# Patient Record
Sex: Male | Born: 2000 | Hispanic: Yes | Marital: Single | Smoking: Never smoker
Health system: Southern US, Community
[De-identification: ages and names within clinical notes are randomized; demographics above are authoritative.]

---
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
## 2019-11-17 ENCOUNTER — Emergency Department
Admission: EM | Admit: 2019-11-17 | Discharge: 2019-11-17 | Disposition: A | Discharge status: Home / Self Care | Payer: Medicaid Other | Attending: Emergency Medicine | Admitting: Emergency Medicine

## 2019-11-17 ENCOUNTER — Encounter: Payer: Self-pay | Admitting: Emergency Medicine

## 2019-11-17 ENCOUNTER — Other Ambulatory Visit: Payer: Self-pay

## 2019-11-17 DIAGNOSIS — Y92810 Car as the place of occurrence of the external cause: Secondary | ICD-10-CM | POA: Insufficient documentation

## 2019-11-17 DIAGNOSIS — H1132 Conjunctival hemorrhage, left eye: Secondary | ICD-10-CM | POA: Insufficient documentation

## 2019-11-17 DIAGNOSIS — Y29XXXA Contact with blunt object, undetermined intent, initial encounter: Secondary | ICD-10-CM | POA: Insufficient documentation

## 2019-11-17 DIAGNOSIS — Y9301 Activity, walking, marching and hiking: Secondary | ICD-10-CM | POA: Insufficient documentation

## 2019-11-17 DIAGNOSIS — Y999 Unspecified external cause status: Secondary | ICD-10-CM | POA: Insufficient documentation

## 2019-11-17 MED ORDER — FLUORESCEIN SODIUM 1 MG OP STRP
1.0000 | ORAL_STRIP | Freq: Once | OPHTHALMIC | Status: DC
  Filled 2019-11-17: qty 1

## 2019-11-17 MED ORDER — TETRACAINE HCL 0.5 % OP SOLN
2.0000 [drp] | Freq: Once | OPHTHALMIC | Status: AC
  Administered 2019-11-17: 2 [drp] via OPHTHALMIC
  Filled 2019-11-17: qty 4

## 2019-11-17 MED ORDER — EYE WASH OPHTH SOLN
1.0000 [drp] | OPHTHALMIC | Status: DC | PRN
  Filled 2019-11-17: qty 118
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
## 2019-11-17 NOTE — Discharge Instructions (Addendum)
Read and follow discharge care instructions.  Cool compresses to the eye for 5 minutes 4 times a day.  Follow-up with ophthalmology if no improvement in 1 week.  Condition normally resolves in 2 weeks.  You may experience some mild light sensitivity.

## 2024-08-01 ENCOUNTER — Emergency Department

## 2024-08-01 ENCOUNTER — Emergency Department
Admission: EM | Admit: 2024-08-01 | Discharge: 2024-08-01 | Disposition: A | Discharge status: Home / Self Care | Attending: Emergency Medicine | Admitting: Emergency Medicine

## 2024-08-01 ENCOUNTER — Other Ambulatory Visit: Payer: Self-pay

## 2024-08-01 DIAGNOSIS — R0789 Other chest pain: Secondary | ICD-10-CM | POA: Diagnosis present

## 2024-08-01 LAB — CBC
HCT: 44 % (ref 39.0–52.0)
Hemoglobin: 15.1 g/dL (ref 13.0–17.0)
MCH: 29.9 pg (ref 26.0–34.0)
MCHC: 34.3 g/dL (ref 30.0–36.0)
MCV: 87.1 fL (ref 80.0–100.0)
Platelets: 245 K/uL (ref 150–400)
RBC: 5.05 MIL/uL (ref 4.22–5.81)
RDW: 13 % (ref 11.5–15.5)
WBC: 11.2 K/uL — ABNORMAL HIGH (ref 4.0–10.5)
nRBC: 0 % (ref 0.0–0.2)

## 2024-08-01 LAB — BASIC METABOLIC PANEL WITH GFR
Anion gap: 13 (ref 5–15)
BUN: 16 mg/dL (ref 6–20)
CO2: 25 mmol/L (ref 22–32)
Calcium: 9.3 mg/dL (ref 8.9–10.3)
Chloride: 100 mmol/L (ref 98–111)
Creatinine, Ser: 0.93 mg/dL (ref 0.61–1.24)
GFR, Estimated: >60 mL/min (ref >60)
Glucose, Bld: 101 mg/dL — ABNORMAL HIGH (ref 70–99)
Potassium: 3.7 mmol/L (ref 3.5–5.1)
Sodium: 138 mmol/L (ref 135–145)

## 2024-08-01 LAB — TROPONIN T, HIGH SENSITIVITY: Troponin T High Sensitivity: <15 ng/L (ref 0–19)

## 2024-08-01 NOTE — ED Provider Notes (Signed)
 [facility] Provider Note    Event Date/Time   First MD Initiated Contact with Patient 08/01/24 1928     (approximate)   History   Chest Pain   HPI  Hector Bray is a 23 y.o. male with no significant past medical history presents emergency department complaining of chest wall pain.  Patient states he gets a pinch every now and then across his chest.  Patient does go to the gym and he lifts heavy objects at work.  Denies shortness of breath, fever, chills, cough or congestion.  No vomiting.  No sweating no swelling that area begins to hurt.  States it will last at a max of 5 minutes and then go away.      Physical Exam   Triage Vital Signs: ED Triage Vitals  Encounter Vitals Group     BP 08/01/24 1911 (!) 153/95     Girls Systolic BP Percentile --      Girls Diastolic BP Percentile --      Boys Systolic BP Percentile --      Boys Diastolic BP Percentile --      Pulse Rate 08/01/24 1911 90     Resp 08/01/24 1911 20     Temp 08/01/24 1911 98.3 F (36.8 C)     Temp src --      SpO2 08/01/24 1911 99 %     Weight 08/01/24 1908 280 lb (127 kg)     Height 08/01/24 1908 6' 2 (1.88 m)     Head Circumference --      Peak Flow --      Pain Score 08/01/24 1908 2     Pain Loc --      Pain Education --      Exclude from Growth Chart --     Most recent vital signs: Vitals:   08/01/24 1911  BP: (!) 153/95  Pulse: 90  Resp: 20  Temp: 98.3 F (36.8 C)  SpO2: 99%     General: Awake, no distress.   CV:  Good peripheral perfusion. regular rate and  rhythm Resp:  Normal effort. Lungs CTA Abd:  No distention.  Nontender Other:      ED Results / Procedures / Treatments   Labs (all labs ordered are listed, but only abnormal results are displayed) Labs Reviewed  BASIC METABOLIC PANEL WITH GFR - Abnormal; Notable for the following components:      Result Value   Glucose, Bld 101 (*)    All other components within normal limits  CBC -  Abnormal; Notable for the following components:   WBC 11.2 (*)    All other components within normal limits  TROPONIN T, HIGH SENSITIVITY  TROPONIN T, HIGH SENSITIVITY     EKG  EKG   RADIOLOGY Chest x-ray    PROCEDURES:   Procedures  Critical Care:  no Chief Complaint  Patient presents with   Chest Pain      MEDICATIONS ORDERED IN ED: Medications - No data to display   IMPRESSION / MDM / ASSESSMENT AND PLAN / ED COURSE  I reviewed the triage vital signs and the nursing notes.                              Differential diagnosis includes, but is not limited to, chest wall pain, muscle strain, MI, NSTEMI, PE, esophagitis, pancreatitis  Patient's presentation is most consistent with acute illness /  injury with system symptoms.   EKG shows normal sinus rhythm, interpret this as being normal, see physician read  Chest x-ray independent review and interpretation by me as being negative for any acute abnormality  Labs are reassuring  Did explain all these findings to the patient.  Exam is reassuring.  Feel this is probably more musculoskeletal.  He can take Tylenol  and ibuprofen to see if it decreases inflammation.  Follow-up with his regular doctor as needed.  Return for worsening.  He is in agreement treatment plan.  Discharged stable condition.      FINAL CLINICAL IMPRESSION(S) / ED DIAGNOSES   Final diagnoses:  Chest wall pain     Rx / DC Orders   ED Discharge Orders     None        Note:  This document was prepared using Dragon voice recognition software and may include unintentional dictation errors.    Gasper Devere ORN, PA-C 08/01/24 2042    Viviann Pastor, MD 08/01/24 2156

## 2024-08-01 NOTE — ED Triage Notes (Signed)
 Pt reports intermittent chest pain for the past 2 weeks. Pt denies accompanying symptoms. Pt denies cardiac hx.

## 2024-08-01 NOTE — Discharge Instructions (Signed)
 Follow-up with regular doctor as needed.  Take Tylenol  or ibuprofen. Your labs and chest x-ray are reassuring.  EKG was normal.  Not appear to be your heart that is causing the problem.  Lungs appear to be okay.
